# Patient Record
Sex: Female | Born: 2000 | Race: White | Hispanic: No | Marital: Single | Smoking: Never smoker
Health system: Southern US, Community
[De-identification: ages and names within clinical notes are randomized; demographics above are authoritative.]

---
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
## 2019-03-12 ENCOUNTER — Other Ambulatory Visit: Payer: Self-pay

## 2019-03-12 DIAGNOSIS — Z20822 Contact with and (suspected) exposure to covid-19: Secondary | ICD-10-CM

## 2019-03-13 LAB — NOVEL CORONAVIRUS, NAA: SARS-CoV-2, NAA: NOT DETECTED

## 2019-03-17 ENCOUNTER — Other Ambulatory Visit: Payer: Self-pay

## 2019-03-17 DIAGNOSIS — Z20822 Contact with and (suspected) exposure to covid-19: Secondary | ICD-10-CM

## 2019-03-19 LAB — NOVEL CORONAVIRUS, NAA: SARS-CoV-2, NAA: NOT DETECTED

## 2019-04-28 ENCOUNTER — Other Ambulatory Visit: Payer: Commercial Managed Care - PPO

## 2019-04-28 ENCOUNTER — Ambulatory Visit: Payer: Commercial Managed Care - PPO | Attending: Internal Medicine

## 2019-04-28 DIAGNOSIS — Z20822 Contact with and (suspected) exposure to covid-19: Secondary | ICD-10-CM

## 2019-04-29 ENCOUNTER — Other Ambulatory Visit: Payer: Commercial Managed Care - PPO

## 2019-04-29 LAB — NOVEL CORONAVIRUS, NAA: SARS-CoV-2, NAA: NOT DETECTED
